# Patient Record
Sex: Male | Born: 2000 | Race: White | Hispanic: No | Marital: Single | Smoking: Never smoker
Health system: Southern US, Community
[De-identification: ages and names within clinical notes are randomized; demographics above are authoritative.]

---
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
## 2019-05-15 IMAGING — CR DG ANKLE COMPLETE 3+V*R*
3 series · 3 of 3 positions shown · non-contrast
Comparison: None.

CLINICAL DATA: Twisting ankle injury this afternoon with swelling
and pain laterally.

EXAM:
RIGHT ANKLE - COMPLETE 3+ VIEW

[ankle ap]
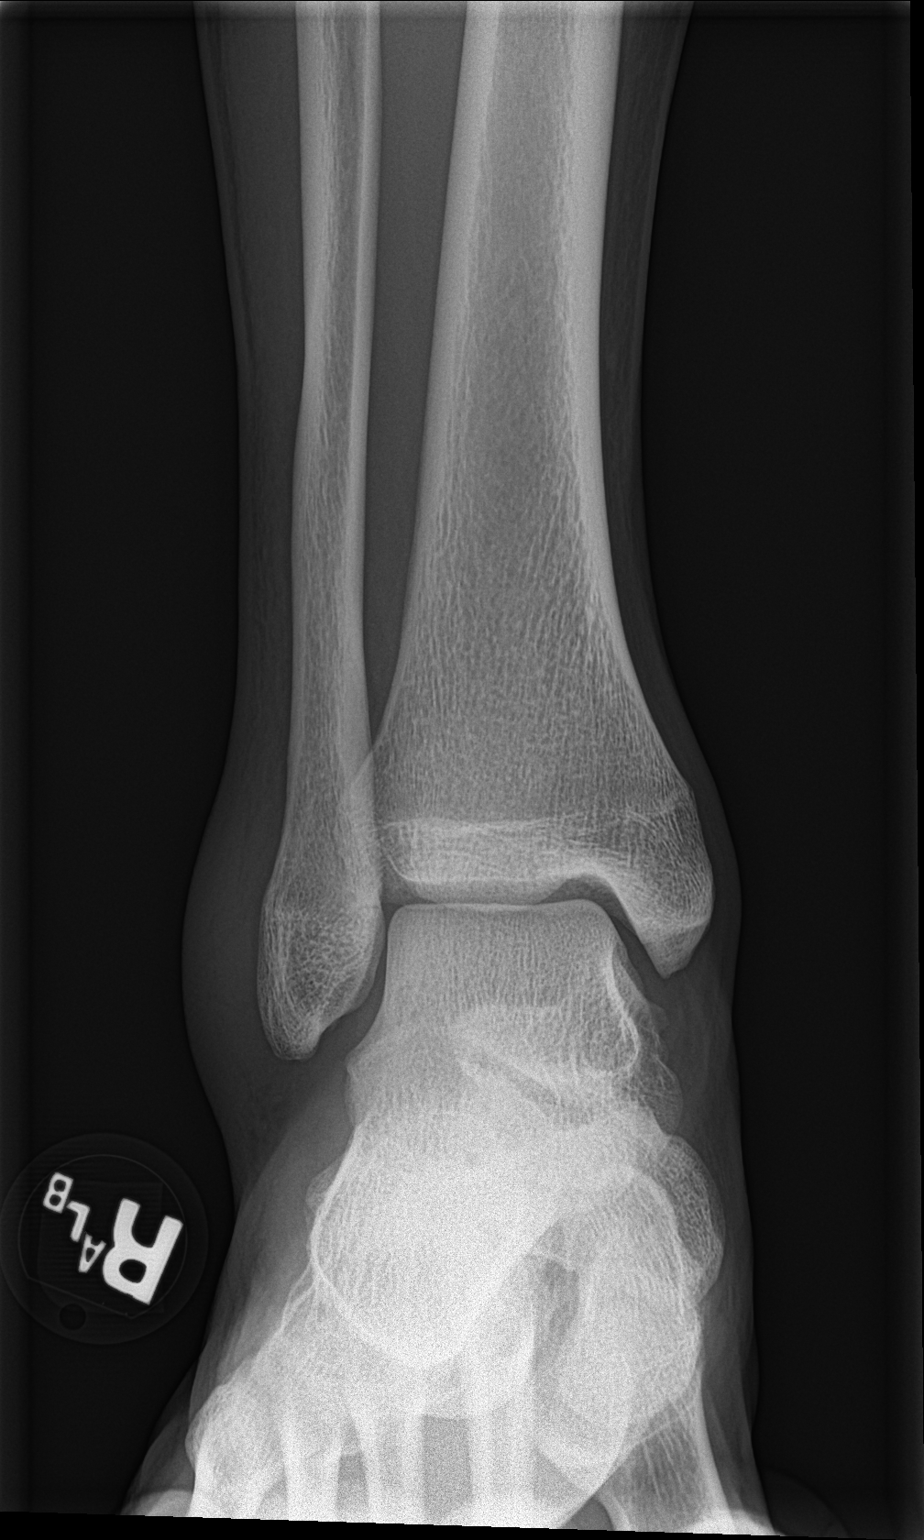

[ankle obl]
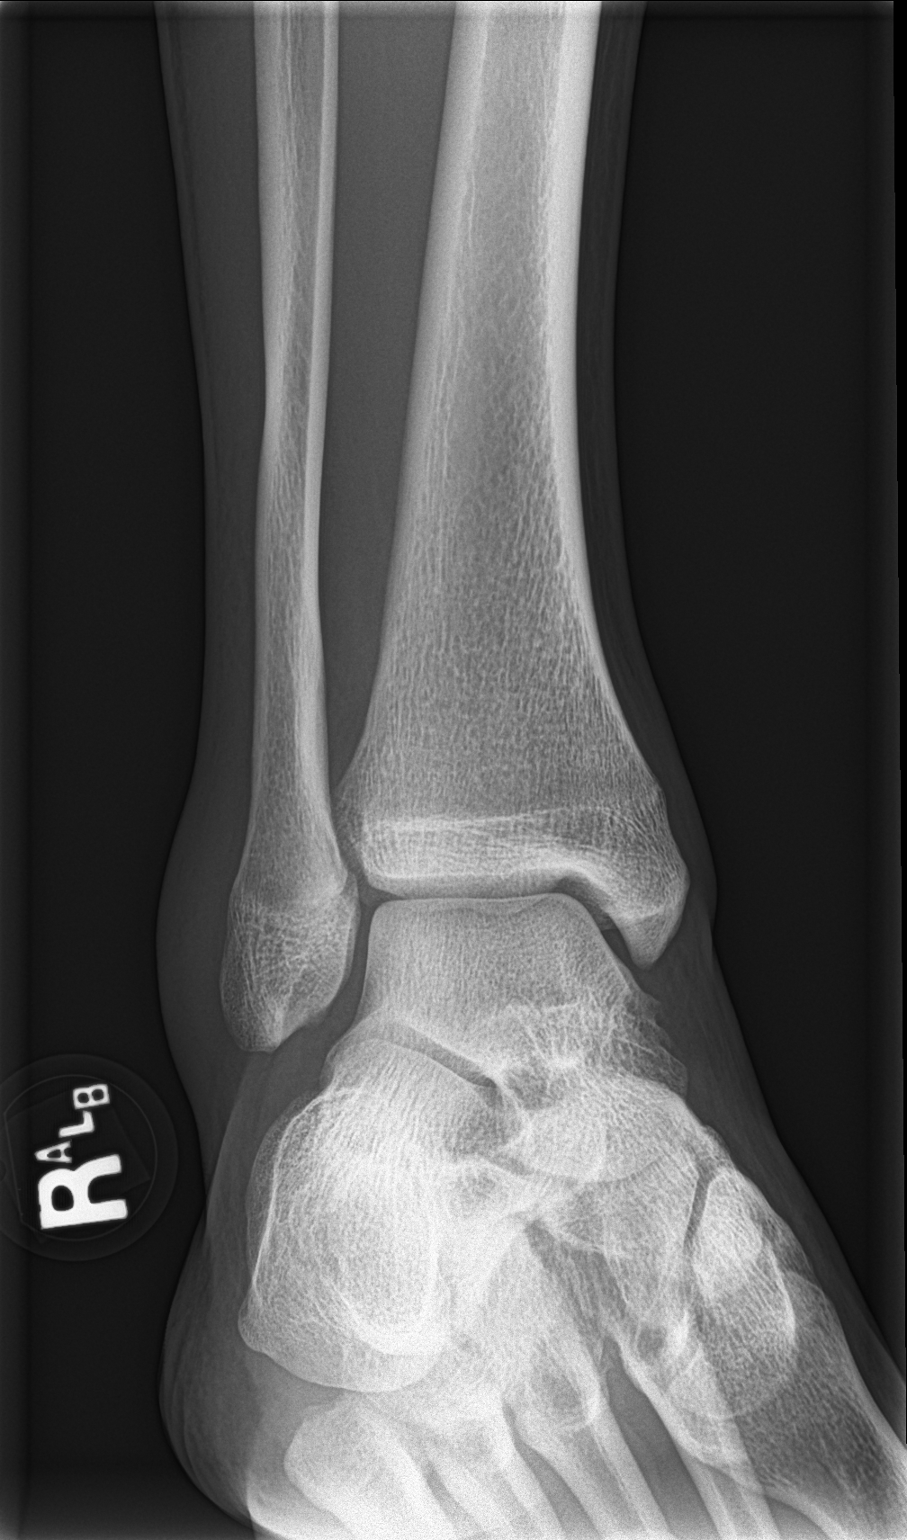

[ankle lat]
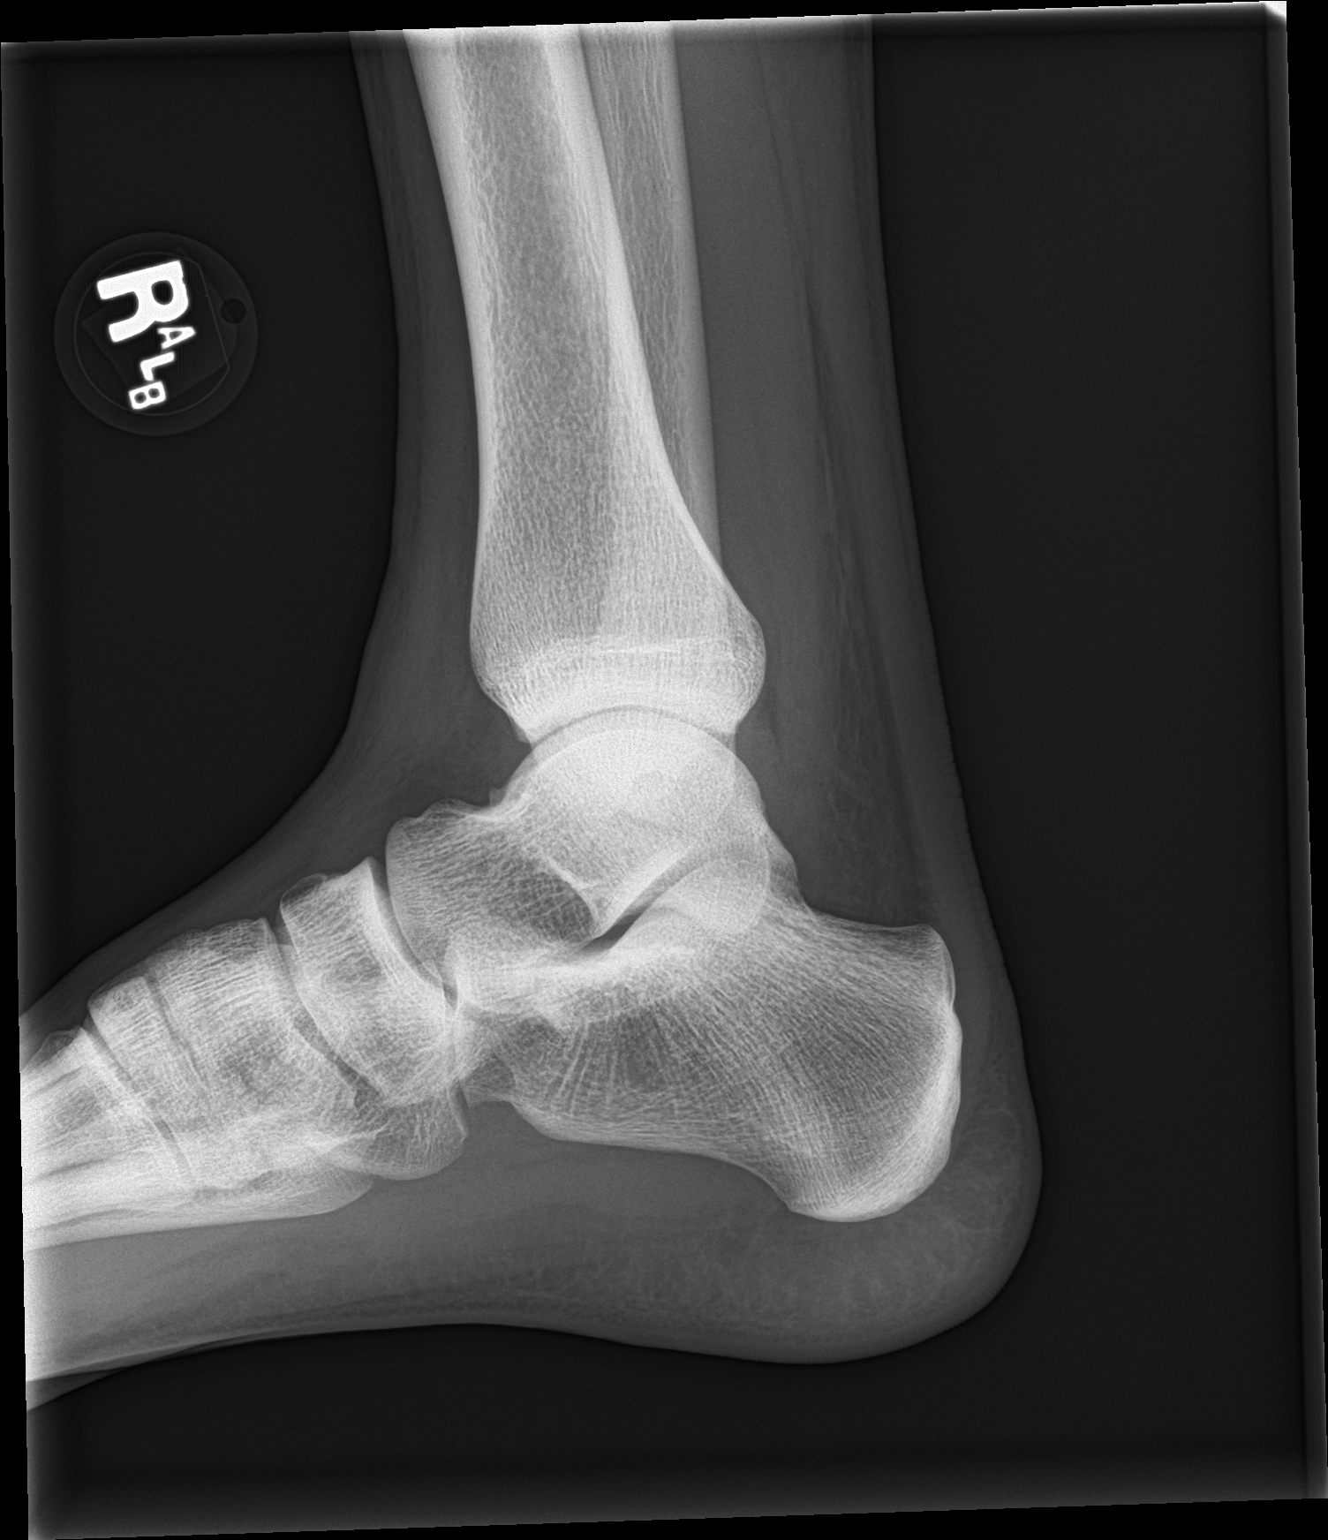

[3 of 3 positions shown; findings below may reference images not displayed]

FINDINGS: Abnormal soft tissue swelling overlying the lateral malleolus noted
with a small tibiotalar joint effusion but without a well-defined
underlying fracture. Fusing growth plates noted.
IMPRESSION: 1. Notable soft tissue swelling over the lateral malleolus with a
small tibiotalar joint effusion, but no appreciable fracture. Fusing
growth plates noted. If pain persists despite conservative therapy,
MRI may be warranted for further characterization.
